# Patient Record
Sex: Male | Born: 1955 | Race: White | Hispanic: No | Marital: Married | Smoking: Never smoker
Health system: Southern US, Community
[De-identification: ages and names within clinical notes are randomized; demographics above are authoritative.]

---

## 2017-12-22 ENCOUNTER — Emergency Department (HOSPITAL_BASED_OUTPATIENT_CLINIC_OR_DEPARTMENT_OTHER): Payer: BLUE CROSS/BLUE SHIELD

## 2017-12-22 ENCOUNTER — Other Ambulatory Visit: Payer: Self-pay

## 2017-12-22 ENCOUNTER — Emergency Department (HOSPITAL_BASED_OUTPATIENT_CLINIC_OR_DEPARTMENT_OTHER)
Admission: EM | Admit: 2017-12-22 | Discharge: 2017-12-22 | Disposition: A | Discharge status: Home / Self Care | Payer: BLUE CROSS/BLUE SHIELD | Attending: Emergency Medicine | Admitting: Emergency Medicine

## 2017-12-22 ENCOUNTER — Encounter (HOSPITAL_BASED_OUTPATIENT_CLINIC_OR_DEPARTMENT_OTHER): Payer: Self-pay | Admitting: *Deleted

## 2017-12-22 DIAGNOSIS — Y998 Other external cause status: Secondary | ICD-10-CM | POA: Diagnosis not present

## 2017-12-22 DIAGNOSIS — Y9389 Activity, other specified: Secondary | ICD-10-CM | POA: Insufficient documentation

## 2017-12-22 DIAGNOSIS — Y9289 Other specified places as the place of occurrence of the external cause: Secondary | ICD-10-CM | POA: Diagnosis not present

## 2017-12-22 DIAGNOSIS — S61210A Laceration without foreign body of right index finger without damage to nail, initial encounter: Secondary | ICD-10-CM | POA: Diagnosis not present

## 2017-12-22 DIAGNOSIS — W458XXA Other foreign body or object entering through skin, initial encounter: Secondary | ICD-10-CM | POA: Diagnosis not present

## 2017-12-22 DIAGNOSIS — T148XXA Other injury of unspecified body region, initial encounter: Secondary | ICD-10-CM

## 2017-12-22 DIAGNOSIS — Z23 Encounter for immunization: Secondary | ICD-10-CM | POA: Insufficient documentation

## 2017-12-22 MED ORDER — CEPHALEXIN 250 MG PO CAPS
500.0000 mg | ORAL_CAPSULE | Freq: Once | ORAL | Status: AC
  Administered 2017-12-22: 500 mg via ORAL
  Filled 2017-12-22: qty 2

## 2017-12-22 MED ORDER — CEPHALEXIN 500 MG PO CAPS: 500.0000 mg | ORAL_CAPSULE | Freq: Four times a day (QID) | ORAL | 0 refills | Status: AC

## 2017-12-22 MED ORDER — DOUBLE ANTIBIOTIC 500-10000 UNIT/GM EX OINT
TOPICAL_OINTMENT | Freq: Two times a day (BID) | CUTANEOUS | Status: DC
  Administered 2017-12-22: 21:00:00 via TOPICAL
  Filled 2017-12-22: qty 28.4

## 2017-12-22 MED ORDER — HYDROCODONE-ACETAMINOPHEN 5-325 MG PO TABS: 1.0000 | ORAL_TABLET | Freq: Four times a day (QID) | ORAL | 0 refills | Status: AC | PRN

## 2017-12-22 MED ORDER — TETANUS-DIPHTH-ACELL PERTUSSIS 5-2.5-18.5 LF-MCG/0.5 IM SUSP
0.5000 mL | Freq: Once | INTRAMUSCULAR | Status: AC
  Administered 2017-12-22: 0.5 mL via INTRAMUSCULAR
  Filled 2017-12-22: qty 0.5

## 2017-12-22 NOTE — ED Triage Notes (Signed)
Laceration/puncture to his right index finger. He grabbed a rusty hook and it stuck in his hand. Bleeding controlled.

## 2017-12-22 NOTE — Discharge Instructions (Addendum)
As we discussed today given the time since the injury, and the puncture nature of the injury closing your wound would result in a higher chance of infection.  There is still a chance of infection.  Please take all of your antibiotics until they are gone.  If you develop generalized redness, worsening pain, are unable to bend the finger, or have other concerns please seek additional medical care and evaluation.  Please follow-up with a hand doctor when you return to [place].  Based on the nature of this injury it does have a high risk for infection.  You may remove your dressing to put new antibiotic ointment on it and clean it every 12 hours.  Please do not soak or submerge your hand as this allows bacteria to get into the wound.  You may allow clean water to run over the wound and gentle soap and pat it dry, however do not scrub, use pressure water, or any other aggressive treatments.  You are being prescribed a medication which may make you sleepy. For 24 hours after one dose please do not drive, operate heavy machinery, care for a small child with out another adult present, or perform any activities that may cause harm to you or someone else if you were to fall asleep or be impaired.    Please take Ibuprofen (Advil, motrin) and Tylenol (acetaminophen) to relieve your pain.  You may take up to 600 MG (3 pills) of normal strength ibuprofen every 8 hours as needed.  In between doses of ibuprofen you make take tylenol, up to 1,000 mg (two extra strength pills).  Do not take more than 3,000 mg tylenol in a 24 hour period.  Please check all medication labels as many medications such as pain and cold medications may contain tylenol.  Do not drink alcohol while taking these medications.  Do not take other NSAID'S while taking ibuprofen (such as aleve or naproxen).  Please take ibuprofen with food to decrease stomach upset.  You may have diarrhea from the antibiotics.  It is very important that you continue to take  the antibiotics even if you get diarrhea unless a medical professional tells you that you may stop taking them.  If you stop too early the bacteria you are being treated for will become stronger and you may need different, more powerful antibiotics that have more side effects and worsening diarrhea.  Please stay well hydrated and consider probiotics as they may decrease the severity of your diarrhea.

## 2017-12-22 NOTE — ED Provider Notes (Signed)
[facility] EMERGENCY DEPARTMENT Provider Note   CSN: [id number] Arrival date & time: 12/22/17  1922     History   Chief Complaint Chief Complaint  Patient presents with  . Laceration    HPI Ethan Irwin is a 62 y.o. male who presents today for evaluation of a finger laceration.  He is here for a fabric show and got his right index finger caught on a rusty hook while hanging fabric at about 2 PM today.  He is unsure when his last tetanus shot was.  He denies any other injuries.  No other concerns today.  He is not immunosuppressed, is not diabetic.    HPI  History reviewed. No pertinent past medical history.  There are no active problems to display for this patient.   History reviewed. No pertinent surgical history.      Home Medications    Prior to Admission medications   Medication Sig Start Date End Date Taking? Authorizing Provider  cephALEXin (KEFLEX) 500 MG capsule Take 1 capsule (500 mg total) by mouth 4 (four) times daily for 10 days. 12/22/17 01/01/18  Cristina Gong, PA-C  HYDROcodone-acetaminophen (NORCO/VICODIN) 5-325 MG tablet Take 1 tablet by mouth every 6 (six) hours as needed. 12/22/17   Cristina Gong, PA-C    Family History History reviewed. No pertinent family history.  Social History Social History   Tobacco Use  . Smoking status: Never Smoker  . Smokeless tobacco: Never Used  Substance Use Topics  . Alcohol use: Yes  . Drug use: Never     Allergies   Sulfa antibiotics   Review of Systems Review of Systems  Constitutional: Negative for chills and fever.  Skin: Positive for wound.  Allergic/Immunologic: Negative for immunocompromised state.  Neurological: Negative for weakness and numbness.  All other systems reviewed and are negative.    Physical Exam Updated Vital Signs BP (!) 150/85   Pulse 98   Temp 98.3 F (36.8 C)   Resp 18   Ht 5\' 10"  (1.778 m)   Wt 90.7 kg   SpO2 99%   BMI 28.70 kg/m    Physical Exam  Constitutional: He appears well-developed. No distress.  Cardiovascular: Normal rate and intact distal pulses.  Distal right index finger has brisk capillary refill.   Musculoskeletal:  Patient has full AROM of the right index finger.   Neurological: No sensory deficit.  Sensation intact to right distal fingers  Skin: Skin is warm and dry. He is not diaphoretic.  Please see clinical image.  There is a 1cm jagged laceration over the middle right phalanx on the palmar aspect.  This is followed by a superficial abrasion proximally.  Wound probed, no obvious extension to bone, no foreign bodies seen.    Nursing note and vitals reviewed.      ED Treatments / Results  Labs (all labs ordered are listed, but only abnormal results are displayed) Labs Reviewed - No data to display  EKG None  Radiology Dg Finger Index Right  Result Date: 12/22/2017 CLINICAL DATA:  Puncture wound. EXAM: RIGHT INDEX FINGER 2+V COMPARISON:  None. FINDINGS: Air is seen along the volar surface of the middle second finger consistent with a site of puncture wound. No foreign bodies noted. No fractures or bony abnormalities. IMPRESSION: No foreign bodies or fractures identified. Air in the soft tissues, likely at the site of puncture. Electronically Signed   By: Gerome Sam III M.D   On: 12/22/2017 20:05    Procedures  Procedures (including critical care time)  Medications Ordered in ED Medications  Tdap (BOOSTRIX) injection 0.5 mL (0.5 mLs Intramuscular Given 12/22/17 2118)  cephALEXin (KEFLEX) capsule 500 mg (500 mg Oral Given 12/22/17 2117)     Initial Impression / Assessment and Plan / ED Course  I have reviewed the triage vital signs and the nursing notes.  Pertinent labs & imaging results that were available during my care of the patient were reviewed by me and considered in my medical decision making (see chart for details).    Ethan HeadlandRobert Saulsbury presents today for evaluation of a  laceration to his right index finger.  This occurred approximately 7 to 8 hours prior to my evaluation.  His tetanus was updated.  X-rays did not show evidence of bony abnormalities.  Based on the puncture nature of his wound along with time since it occurred in general contaminated nature decision was made not to close the wound.  Right index finger is neurovascularly intact.  Did discuss with patient high chance of infection.  Wound was irrigated in the dept.  Given strict return precautions including those for flexor tenosynovitis and infection.  He is given follow-up with hand surgery, however he is not local and will most likely end up following up in [place] where he is from.  PMP queried for the patient prior to the prescription of home narcotic medications.  He was given a dose of Keflex while in the department, along with prescription for continued Keflex at home.  Patient was discussed with supervising physician who agreed with plan not to suture wound.  Final Clinical Impressions(s) / ED Diagnoses   Final diagnoses:  Laceration of right index finger without foreign body without damage to nail, initial encounter  Puncture wound    ED Discharge Orders         Ordered    cephALEXin (KEFLEX) 500 MG capsule  4 times daily     12/22/17 2107    HYDROcodone-acetaminophen (NORCO/VICODIN) 5-325 MG tablet  Every 6 hours PRN     12/22/17 2107           Cristina GongHammond, Lilana Blasko W, PA-C 12/23/17 0132    Virgina Norfolkuratolo, Adam, DO 12/23/17 1041

## 2017-12-22 NOTE — ED Notes (Signed)
Pt hand on cleaning solution, suture car at the bedside.

## 2020-01-19 IMAGING — DX DG FINGER INDEX 2+V*R*
3 series · 3 of 3 positions shown · non-contrast
Comparison: None.

CLINICAL DATA: Puncture wound.

EXAM:
RIGHT INDEX FINGER 2+V

[finger ap]
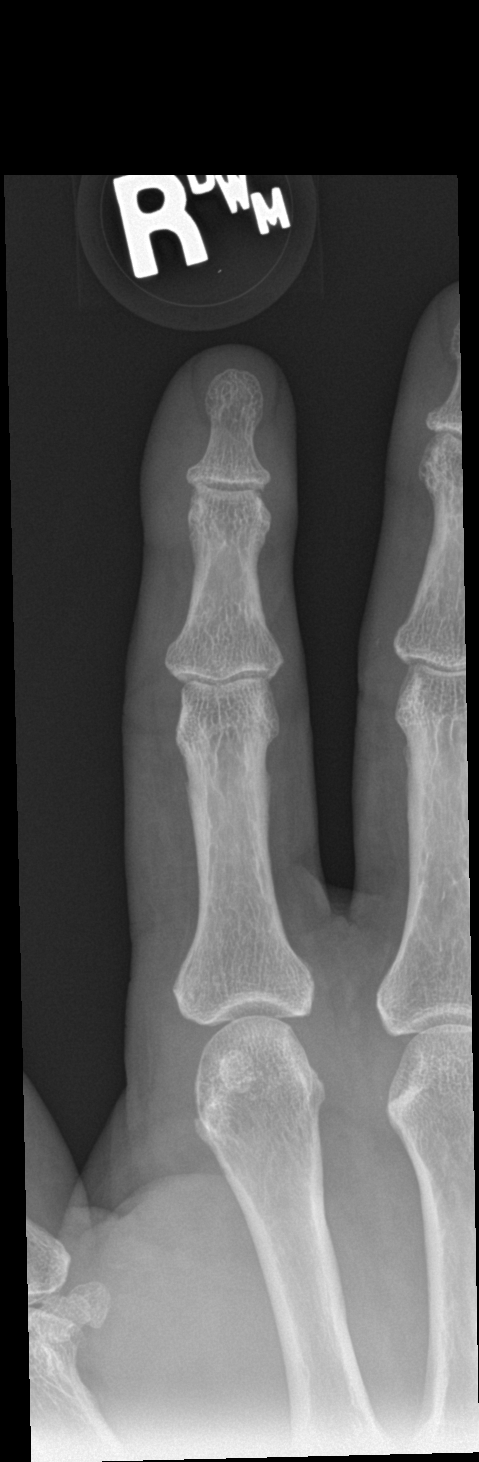

[finger obl]
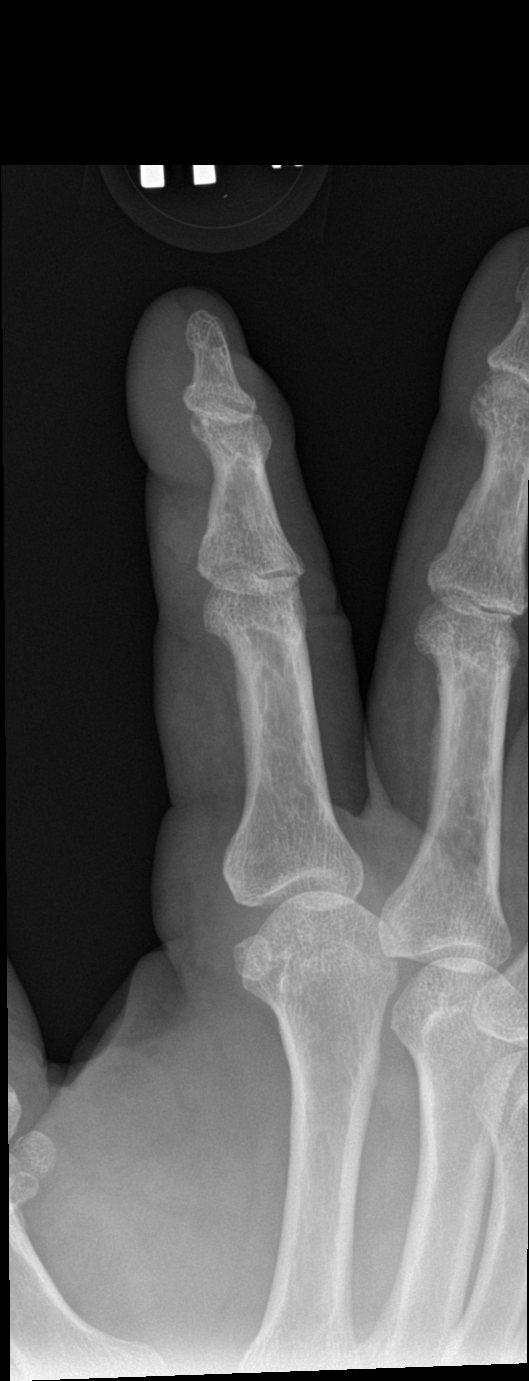

[finger lat]
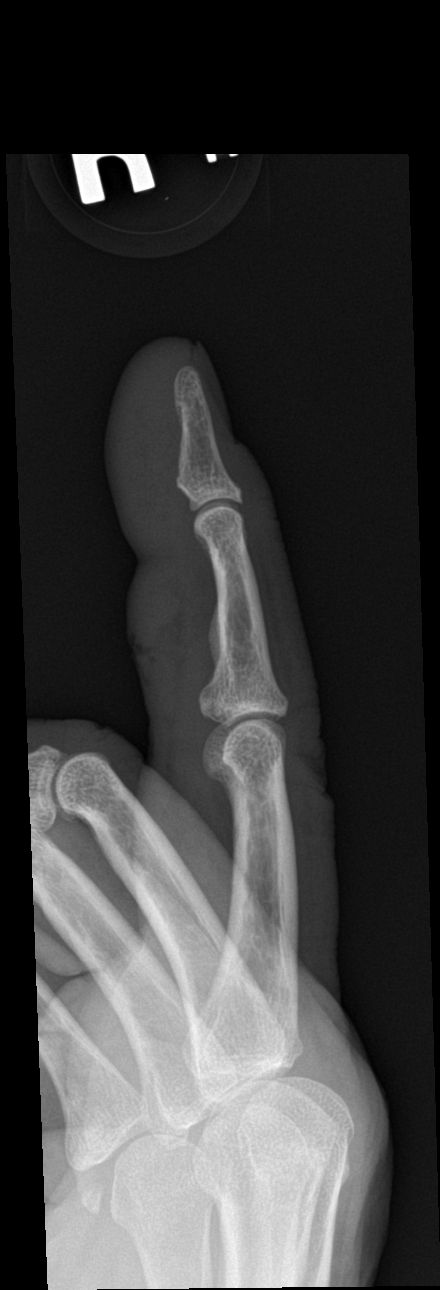

[3 of 3 positions shown; findings below may reference images not displayed]

FINDINGS: Air is seen along the volar surface of the middle second finger
consistent with a site of puncture wound. No foreign bodies noted.
No fractures or bony abnormalities.
IMPRESSION: No foreign bodies or fractures identified. Air in the soft tissues,
likely at the site of puncture.
